# Patient Record
Sex: Male | Born: 2000 | Race: White | Hispanic: No | Marital: Single | Smoking: Never smoker
Health system: Southern US, Community
[De-identification: ages and names within clinical notes are randomized; demographics above are authoritative.]

---
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
## 2022-09-12 ENCOUNTER — Ambulatory Visit (HOSPITAL_COMMUNITY)
Admission: EM | Admit: 2022-09-12 | Discharge: 2022-09-12 | Disposition: A | Discharge status: Home / Self Care | Payer: Medicaid Other | Attending: Psychiatry | Admitting: Psychiatry

## 2022-09-12 DIAGNOSIS — Z653 Problems related to other legal circumstances: Secondary | ICD-10-CM | POA: Insufficient documentation

## 2022-09-12 DIAGNOSIS — R45851 Suicidal ideations: Secondary | ICD-10-CM | POA: Insufficient documentation

## 2022-09-12 DIAGNOSIS — F4323 Adjustment disorder with mixed anxiety and depressed mood: Secondary | ICD-10-CM | POA: Insufficient documentation
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
## 2022-09-12 NOTE — ED Notes (Signed)
RN was instructed by NP Jerrilyn Cairo to notify BHRT that patient was ready for pick up and transfer to the jail as he is to start a sentence this morning.  BHRRT called and was en route.  Patient started banging on the door and provider allowed for himto be brought to the lobby by security without informing nursing.  Patient then took it upon himself to depart from facility without waiting for transport or receiving AVS.  RN called BHRT back to cancel pick up.

## 2022-09-12 NOTE — Discharge Instructions (Addendum)
Return on 09/15/22 at 7:15 am to establish with mental health outpatient services.  You may return sooner for any thoughts of suicide or thoughts of harming others.

## 2022-10-16 ENCOUNTER — Ambulatory Visit (HOSPITAL_COMMUNITY)
Admission: EM | Admit: 2022-10-16 | Discharge: 2022-10-16 | Disposition: A | Discharge status: Home / Self Care | Payer: Medicaid Other | Attending: Internal Medicine | Admitting: Internal Medicine

## 2022-10-16 ENCOUNTER — Ambulatory Visit (INDEPENDENT_AMBULATORY_CARE_PROVIDER_SITE_OTHER): Payer: Medicaid Other

## 2022-10-16 ENCOUNTER — Encounter (HOSPITAL_COMMUNITY): Payer: Self-pay

## 2022-10-16 DIAGNOSIS — S060X0A Concussion without loss of consciousness, initial encounter: Secondary | ICD-10-CM | POA: Diagnosis not present

## 2022-10-16 DIAGNOSIS — S134XXA Sprain of ligaments of cervical spine, initial encounter: Secondary | ICD-10-CM

## 2022-10-16 MED ORDER — METHOCARBAMOL 500 MG PO TABS: 500.0000 mg | ORAL_TABLET | Freq: Two times a day (BID) | ORAL | 0 refills | Status: DC

## 2022-10-16 MED ORDER — IBUPROFEN 600 MG PO TABS: 600.0000 mg | ORAL_TABLET | Freq: Four times a day (QID) | ORAL | 0 refills | Status: DC | PRN
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
## 2023-02-15 ENCOUNTER — Other Ambulatory Visit: Payer: Self-pay

## 2023-02-15 ENCOUNTER — Encounter (HOSPITAL_COMMUNITY): Payer: Self-pay

## 2023-02-15 ENCOUNTER — Emergency Department (HOSPITAL_COMMUNITY)
Admission: EM | Admit: 2023-02-15 | Discharge: 2023-02-15 | Discharge status: Against Medical Advice | Payer: No Typology Code available for payment source | Attending: Emergency Medicine | Admitting: Emergency Medicine

## 2023-02-15 DIAGNOSIS — Z5321 Procedure and treatment not carried out due to patient leaving prior to being seen by health care provider: Secondary | ICD-10-CM | POA: Insufficient documentation

## 2023-02-15 DIAGNOSIS — M791 Myalgia, unspecified site: Secondary | ICD-10-CM | POA: Diagnosis not present

## 2023-02-15 DIAGNOSIS — R112 Nausea with vomiting, unspecified: Secondary | ICD-10-CM | POA: Diagnosis present

## 2023-02-15 DIAGNOSIS — R0981 Nasal congestion: Secondary | ICD-10-CM | POA: Insufficient documentation

## 2023-02-15 DIAGNOSIS — Z20822 Contact with and (suspected) exposure to covid-19: Secondary | ICD-10-CM | POA: Insufficient documentation

## 2023-02-15 LAB — COMPREHENSIVE METABOLIC PANEL
ALT: 23 U/L (ref 0–44)
AST: 27 U/L (ref 15–41)
Albumin: 4.4 g/dL (ref 3.5–5.0)
Alkaline Phosphatase: 70 U/L (ref 38–126)
Anion gap: 14 (ref 5–15)
BUN: 8 mg/dL (ref 6–20)
CO2: 24 mmol/L (ref 22–32)
Calcium: 9.8 mg/dL (ref 8.9–10.3)
Chloride: 98 mmol/L (ref 98–111)
Creatinine, Ser: 0.99 mg/dL (ref 0.61–1.24)
GFR, Estimated: >60 mL/min (ref >60)
Glucose, Bld: 109 mg/dL — ABNORMAL HIGH (ref 70–99)
Potassium: 3.6 mmol/L (ref 3.5–5.1)
Sodium: 136 mmol/L (ref 135–145)
Total Bilirubin: 1.1 mg/dL (ref 0.0–1.2)
Total Protein: 7.6 g/dL (ref 6.5–8.1)

## 2023-02-15 LAB — CBC
HCT: 48 % (ref 39.0–52.0)
Hemoglobin: 16.6 g/dL (ref 13.0–17.0)
MCH: 29.2 pg (ref 26.0–34.0)
MCHC: 34.6 g/dL (ref 30.0–36.0)
MCV: 84.5 fL (ref 80.0–100.0)
Platelets: 313 10*3/uL (ref 150–400)
RBC: 5.68 MIL/uL (ref 4.22–5.81)
RDW: 12.5 % (ref 11.5–15.5)
WBC: 7.5 10*3/uL (ref 4.0–10.5)
nRBC: 0 % (ref 0.0–0.2)

## 2023-02-15 LAB — RESP PANEL BY RT-PCR (RSV, FLU A&B, COVID)  RVPGX2
Influenza A by PCR: NEGATIVE
Influenza B by PCR: NEGATIVE
Resp Syncytial Virus by PCR: NEGATIVE
SARS Coronavirus 2 by RT PCR: NEGATIVE

## 2023-02-15 LAB — LIPASE, BLOOD: Lipase: 32 U/L (ref 11–51)

## 2023-02-15 MED ORDER — ONDANSETRON 4 MG PO TBDP
4.0000 mg | ORAL_TABLET | Freq: Once | ORAL | Status: AC
  Administered 2023-02-15: 4 mg via ORAL
  Filled 2023-02-15: qty 1

## 2023-02-15 NOTE — ED Provider Triage Note (Signed)
Emergency Medicine Provider Triage Evaluation Note  Mario Curtis , a 23 y.o. male  was evaluated in triage.  Pt complains of nausea and vomiting x 3 days. Blood tinged emesis (dark red) twice.   Review of Systems  Positive: Abd pain, N/V, blood tinged emesis, chills Negative: Diarrhea, cough, congestion  Physical Exam  There were no vitals taken for this visit. Gen:   Awake, no distress   Resp:  Normal effort  MSK:   Moves extremities without difficulty  Other:    Medical Decision Making  Medically screening exam initiated at 7:49 AM.  Appropriate orders placed.  Ashton Belote was informed that the remainder of the evaluation will be completed by another provider, this initial triage assessment does not replace that evaluation, and the importance of remaining in the ED until their evaluation is complete.  Workup initiated   Su Monks, PA-C 02/15/23 6160

## 2023-02-15 NOTE — ED Triage Notes (Signed)
Patient reports he has been vomiting for a couple day swith body aches and congestion.  Reports blood was dark.  Admits to using percocets that are not prescribed.

## 2023-06-23 ENCOUNTER — Ambulatory Visit (HOSPITAL_COMMUNITY): Admitting: Student

## 2023-06-23 DIAGNOSIS — F1290 Cannabis use, unspecified, uncomplicated: Secondary | ICD-10-CM

## 2023-06-23 DIAGNOSIS — F411 Generalized anxiety disorder: Secondary | ICD-10-CM | POA: Diagnosis not present

## 2023-06-23 MED ORDER — PROPRANOLOL HCL 10 MG PO TABS: 10.0000 mg | ORAL_TABLET | Freq: Two times a day (BID) | ORAL | 1 refills | Status: AC | PRN

## 2023-06-23 MED ORDER — ESCITALOPRAM OXALATE 10 MG PO TABS: ORAL_TABLET | ORAL | 1 refills | Status: AC
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
## 2023-08-17 ENCOUNTER — Encounter (HOSPITAL_COMMUNITY): Admitting: Psychiatry
